# Patient Record
Sex: Male | Born: 1945 | Race: White | Hispanic: No | Marital: Married | Smoking: Never smoker
Health system: Southern US, Community
[De-identification: ages and names within clinical notes are randomized; demographics above are authoritative.]

## PROBLEM LIST (undated history)

## (undated) DIAGNOSIS — G309 Alzheimer's disease, unspecified: Secondary | ICD-10-CM

## (undated) DIAGNOSIS — I10 Essential (primary) hypertension: Secondary | ICD-10-CM

## (undated) DIAGNOSIS — F0280 Dementia in other diseases classified elsewhere without behavioral disturbance: Secondary | ICD-10-CM

---

## 2006-09-13 ENCOUNTER — Emergency Department (HOSPITAL_COMMUNITY): Admission: EM | Admit: 2006-09-13 | Discharge: 2006-09-13 | Payer: Self-pay | Admitting: Emergency Medicine

## 2006-09-26 ENCOUNTER — Ambulatory Visit: Payer: Self-pay | Admitting: Psychology

## 2006-12-08 ENCOUNTER — Ambulatory Visit: Payer: Self-pay | Admitting: Internal Medicine

## 2012-03-12 ENCOUNTER — Other Ambulatory Visit (HOSPITAL_COMMUNITY): Payer: Self-pay | Admitting: Neurology

## 2012-03-12 DIAGNOSIS — R1310 Dysphagia, unspecified: Secondary | ICD-10-CM

## 2012-03-12 DIAGNOSIS — F0390 Unspecified dementia without behavioral disturbance: Secondary | ICD-10-CM

## 2012-03-17 ENCOUNTER — Ambulatory Visit (HOSPITAL_COMMUNITY)
Admission: RE | Admit: 2012-03-17 | Discharge: 2012-03-17 | Disposition: A | Discharge status: Home / Self Care | Payer: Medicare Other | Source: Ambulatory Visit | Attending: Neurology | Admitting: Neurology

## 2012-03-17 DIAGNOSIS — F0390 Unspecified dementia without behavioral disturbance: Secondary | ICD-10-CM

## 2012-03-17 DIAGNOSIS — R1313 Dysphagia, pharyngeal phase: Secondary | ICD-10-CM | POA: Insufficient documentation

## 2012-03-17 DIAGNOSIS — R1310 Dysphagia, unspecified: Secondary | ICD-10-CM | POA: Insufficient documentation

## 2012-03-17 NOTE — Procedures (Signed)
Objective Swallowing Evaluation: Modified Barium Swallowing Study  Patient Details  Name: Keith Herring MRN: [id number] Date of Birth: Jun 20, 1945  Today's Date: 03/17/2012 Time: 1200-1230 SLP Time Calculation (min): 30 min  Past Medical History: No past medical history on file. Past Surgical History: No past surgical history on file. HPI:  67 y/o male with history of dementia referred for objective assessment of MBS due to concerns with aspiration.  Patient's spouse and main caregiver's description of dysphagia is difficulty swallowing pills with increased coughing and "gagging" during meals.  Symptoms started approximately  two weeks prior.  No reports of recent weight loss.     Assessment / Plan / Recommendation Clinical Impression  Dysphagia Diagnosis: Severe oral phase dysphagia;Moderate pharyngeal phase dysphagia Clinical impression: Moderate to severe sensory-motor oral dysphagia with moderate sensory-motor pharyngeal dysphagia.  Oral phase characterized by odd overall disorganized pattern with tongue pumping, reduced bolus cohesion and slow oral transit.  All swallows piecemeal.  Pharyngeal phase characterized by   reduced TBR,  sluggish, inconsistent velopharyngeal closure, reduced pharyngeal peristalsis, decreased hyoid anterior excursion and reduced laryngeal closure.  Trace silent aspiration during the swallow occurred x1 with thin liquid administered by straw out of three trials.  Cued cough and throat clear effective to remove aspirated material.   Moderate amount of residuals in vallecular space s/p swallow with puree and solid trials.  Cued double swallow effective in reducing majority of containment but patient required max verbal cueing to complete.  Patient unable to complete postural maneuver of chin tuck paired with double swallow to remove residuals mainly due to cognitive deficits. Trace residuals in vallecular space and some pooling at pyriforms s/p swallow of thin liquid  barium by cup. Whole barium tablet "hung" in pharynx during swallow at approximately C-4 level. Transit completed during spontaneous second swallow.   Greatest risk of aspiration is after the swallow  due to residuals in valleculae.  Patient self administered liquid trials but was unable to self administer puree and regular solids due to decreased procedural memory.   Recommend to continue current diet of regular consistency and thin liquids with full supervision with PO intake. Crush pills if possible and administer in puree.  Patient to benefit from continued ST services for dysphagia treatment in Out Patient setting as aspiration risk remains even with recommended safe, swallow strategies. Strategies of double swallow effective but question patient's ability to complete consistently.         Treatment Recommendation  Dysphagia treatment in outpatient setting    Diet Recommendation Regular;Thin liquid   Liquid Administration via: Cup;No straw Medication Administration: Crushed with puree Supervision: Patient able to self feed;Full supervision/cueing for compensatory strategies Compensations: Slow rate;Small sips/bites;Multiple dry swallows after each bite/sip Postural Changes and/or Swallow Maneuvers: Seated upright 90 degrees;Upright 30-60 min after meal    Other  Recommendations Oral Care Recommendations: Oral care BID   Follow Up Recommendations  Outpatient SLP                General Date of Onset: 03/11/12 HPI: 67 y/o male with history of dementia referred for objective assessment of MBS due to concerns with aspiration.  Patient's spouse description of dysphagia is difficulty swallowing pills with increased coughing and "gagging" during meals approximately starting two weeks prior.  Type of Study: Modified Barium Swallowing Study Reason for Referral: Objectively evaluate swallowing function Diet Prior to this Study: Regular;Thin liquids Temperature Spikes Noted: No Respiratory  Status: Room air History of Recent Intubation: No Behavior/Cognition: Alert;Cooperative;Pleasant mood;Confused;Requires  cueing;Distractible;Impulsive Oral Cavity - Dentition: Adequate natural dentition Oral Motor / Sensory Function: Impaired motor;Impaired sensory Oral impairment: Right lingual;Right facial;Right labial Self-Feeding Abilities: Needs assist Patient Positioning: Upright in chair Baseline Vocal Quality: Clear Volitional Cough: Strong Volitional Swallow: Able to elicit Anatomy: Within functional limits Pharyngeal Secretions: Not observed secondary MBS    Reason for Referral Objectively evaluate swallowing function   Oral Phase Oral Preparation/Oral Phase Oral Phase: Impaired Oral - Thin Oral - Thin Teaspoon: Lingual pumping;Incomplete tongue to palate contact;Reduced posterior propulsion;Weak lingual manipulation;Holding of bolus;Lingual/palatal residue;Piecemeal swallowing;Decreased velopharyngeal closure;Delayed oral transit Oral - Thin Cup: Lingual pumping;Incomplete tongue to palate contact;Reduced posterior propulsion;Holding of bolus;Lingual/palatal residue;Piecemeal swallowing;Decreased velopharyngeal closure;Delayed oral transit Oral - Thin Straw: Incomplete tongue to palate contact;Piecemeal swallowing;Decreased velopharyngeal closure;Reduced posterior propulsion;Weak lingual manipulation;Holding of bolus;Delayed oral transit Oral - Solids Oral - Puree: Lingual pumping;Incomplete tongue to palate contact;Reduced posterior propulsion;Holding of bolus;Lingual/palatal residue;Piecemeal swallowing;Decreased velopharyngeal closure;Delayed oral transit Oral - Mechanical Soft: Lingual pumping;Incomplete tongue to palate contact;Reduced posterior propulsion;Holding of bolus;Weak lingual manipulation;Piecemeal swallowing;Decreased velopharyngeal closure;Delayed oral transit Oral - Regular: Lingual pumping;Impaired mastication;Weak lingual manipulation;Incomplete tongue to palate  contact;Reduced posterior propulsion;Holding of bolus;Delayed oral transit;Piecemeal swallowing;Decreased velopharyngeal closure   Pharyngeal Phase Pharyngeal Phase Pharyngeal Phase: Impaired Pharyngeal - Thin Pharyngeal - Thin Cup: Delayed swallow initiation;Premature spillage to valleculae;Reduced pharyngeal peristalsis;Reduced anterior laryngeal mobility;Reduced laryngeal elevation;Reduced airway/laryngeal closure;Pharyngeal residue - pyriform sinuses;Pharyngeal residue - cp segment;Pharyngeal residue - valleculae;Reduced tongue base retraction Pharyngeal - Thin Straw: Premature spillage to pyriform sinuses;Reduced pharyngeal peristalsis;Reduced anterior laryngeal mobility;Reduced laryngeal elevation;Reduced tongue base retraction;Reduced airway/laryngeal closure;Penetration/Aspiration during swallow;Trace aspiration;Pharyngeal residue - valleculae;Pharyngeal residue - cp segment;Pharyngeal residue - posterior pharnyx;Compensatory strategies attempted (Comment) (trace silent aspiration during x1 ) Penetration/Aspiration details (thin straw): Material enters airway, remains ABOVE vocal cords and not ejected out Pharyngeal - Solids Pharyngeal - Puree: Reduced pharyngeal peristalsis;Reduced anterior laryngeal mobility;Reduced laryngeal elevation;Reduced airway/laryngeal closure;Reduced tongue base retraction;Pharyngeal residue - valleculae;Pharyngeal residue - posterior pharnyx;Pharyngeal residue - cp segment Pharyngeal - Mechanical Soft: Reduced laryngeal elevation;Reduced pharyngeal peristalsis;Reduced anterior laryngeal mobility;Reduced airway/laryngeal closure;Reduced tongue base retraction;Pharyngeal residue - valleculae;Pharyngeal residue - pyriform sinuses;Pharyngeal residue - cp segment Pharyngeal - Regular: Reduced pharyngeal peristalsis;Reduced anterior laryngeal mobility;Reduced laryngeal elevation;Reduced airway/laryngeal closure;Reduced tongue base retraction;Pharyngeal residue -  valleculae;Pharyngeal residue - pyriform sinuses;Pharyngeal residue - cp segment Pharyngeal - Pill: Reduced pharyngeal peristalsis;Reduced laryngeal elevation;Reduced tongue base retraction;Pharyngeal residue - valleculae Pharyngeal Phase - Comment Pharyngeal Comment: Pill lodged in pharynx approxiamately at C-4 but completed transit with spontaneous dry swallow   Cervical Esophageal Phase    GO    Cervical Esophageal Phase Cervical Esophageal Phase: Impaired Cervical Esophageal Phase - Thin Thin Cup: Reduced cricopharyngeal relaxation Cervical Esophageal Phase - Solids Regular: Reduced cricopharyngeal relaxation    Functional Assessment Tool Used: Clinical judgement  Functional Limitations: Swallowing Swallow Current Status (Z6109): At least 40 percent but less than 60 percent impaired, limited or restricted Swallow Goal Status [phone]): At least 40 percent but less than 60 percent impaired, limited or restricted Swallow Discharge Status [phone]): At least 40 percent but less than 60 percent impaired, limited or restricted   Moreen Fowler MS, CCC-SLP [phone] [facility] 03/17/2012, 3:27 PM

## 2013-08-20 ENCOUNTER — Inpatient Hospital Stay (HOSPITAL_COMMUNITY)
Admission: EM | Admit: 2013-08-20 | Discharge: 2013-08-23 | DRG: 177 | Disposition: A | Discharge status: Hospice | Payer: Medicare Other | Attending: Family Medicine | Admitting: Family Medicine

## 2013-08-20 ENCOUNTER — Encounter (HOSPITAL_COMMUNITY): Payer: Self-pay | Admitting: Emergency Medicine

## 2013-08-20 ENCOUNTER — Emergency Department (HOSPITAL_COMMUNITY): Payer: Medicare Other

## 2013-08-20 DIAGNOSIS — R0902 Hypoxemia: Secondary | ICD-10-CM | POA: Diagnosis not present

## 2013-08-20 DIAGNOSIS — M542 Cervicalgia: Secondary | ICD-10-CM

## 2013-08-20 DIAGNOSIS — E870 Hyperosmolality and hypernatremia: Secondary | ICD-10-CM | POA: Diagnosis present

## 2013-08-20 DIAGNOSIS — J690 Pneumonitis due to inhalation of food and vomit: Secondary | ICD-10-CM | POA: Diagnosis not present

## 2013-08-20 DIAGNOSIS — Z66 Do not resuscitate: Secondary | ICD-10-CM | POA: Diagnosis present

## 2013-08-20 DIAGNOSIS — Z79899 Other long term (current) drug therapy: Secondary | ICD-10-CM

## 2013-08-20 DIAGNOSIS — F0280 Dementia in other diseases classified elsewhere without behavioral disturbance: Secondary | ICD-10-CM | POA: Diagnosis present

## 2013-08-20 DIAGNOSIS — Z515 Encounter for palliative care: Secondary | ICD-10-CM

## 2013-08-20 DIAGNOSIS — R4701 Aphasia: Secondary | ICD-10-CM | POA: Diagnosis present

## 2013-08-20 DIAGNOSIS — I10 Essential (primary) hypertension: Secondary | ICD-10-CM | POA: Diagnosis present

## 2013-08-20 DIAGNOSIS — G309 Alzheimer's disease, unspecified: Secondary | ICD-10-CM | POA: Diagnosis present

## 2013-08-20 DIAGNOSIS — Z993 Dependence on wheelchair: Secondary | ICD-10-CM

## 2013-08-20 DIAGNOSIS — Z7982 Long term (current) use of aspirin: Secondary | ICD-10-CM

## 2013-08-20 DIAGNOSIS — F0390 Unspecified dementia without behavioral disturbance: Secondary | ICD-10-CM | POA: Diagnosis present

## 2013-08-20 DIAGNOSIS — G9340 Encephalopathy, unspecified: Secondary | ICD-10-CM | POA: Diagnosis not present

## 2013-08-20 HISTORY — DX: Essential (primary) hypertension: I10

## 2013-08-20 HISTORY — DX: Alzheimer's disease, unspecified: G30.9

## 2013-08-20 HISTORY — DX: Dementia in other diseases classified elsewhere, unspecified severity, without behavioral disturbance, psychotic disturbance, mood disturbance, and anxiety: F02.80

## 2013-08-20 LAB — CBC WITH DIFFERENTIAL/PLATELET
Basophils Absolute: 0 10*3/uL (ref 0.0–0.1)
Basophils Relative: 0 % (ref 0–1)
EOS PCT: 2 % (ref 0–5)
Eosinophils Absolute: 0.2 10*3/uL (ref 0.0–0.7)
HCT: 44.1 % (ref 39.0–52.0)
Hemoglobin: 15 g/dL (ref 13.0–17.0)
LYMPHS ABS: 2.5 10*3/uL (ref 0.7–4.0)
LYMPHS PCT: 25 % (ref 12–46)
MCH: 32.1 pg (ref 26.0–34.0)
MCHC: 34 g/dL (ref 30.0–36.0)
MCV: 94.4 fL (ref 78.0–100.0)
Monocytes Absolute: 0.9 10*3/uL (ref 0.1–1.0)
Monocytes Relative: 9 % (ref 3–12)
NEUTROS ABS: 6.4 10*3/uL (ref 1.7–7.7)
Neutrophils Relative %: 64 % (ref 43–77)
PLATELETS: 205 10*3/uL (ref 150–400)
RBC: 4.67 MIL/uL (ref 4.22–5.81)
RDW: 12.9 % (ref 11.5–15.5)
WBC: 10 10*3/uL (ref 4.0–10.5)

## 2013-08-20 LAB — I-STAT CG4 LACTIC ACID, ED: Lactic Acid, Venous: 1.14 mmol/L (ref 0.5–2.2)

## 2013-08-20 NOTE — ED Notes (Signed)
Dr Otter at bedside  

## 2013-08-20 NOTE — ED Notes (Signed)
Pt arrives via EMS from [facility], pt is currently in transition to the memory care unit. Pts wife came to check on the pt and found him with sputum coming out of his mouth and blue in the lips. pts wife states that while pt was sitting up pt was not coughing or gurgling. States that once he laid down the symptoms started. Wife denies hx of seizures. Facility states that pt has been lethargic x4 days, pt was checked for UTI, negative. Pt is normally non-verbal with hx of alzheimers, dementia and htn. EMS found pupils to be pinpoint and appeared unreactive. EMS gave 1 Narcan, pupils became more reactive. pts wife states that pt stays in pain from head to toe and takes pain medicine twice a day. Wife also states that up until a week ago pt was ambulatory. States that the facility did a swallow test on pt because he was not swallowing well. 146/74 HR 100 .

## 2013-08-21 ENCOUNTER — Encounter (HOSPITAL_COMMUNITY): Payer: Self-pay | Admitting: Internal Medicine

## 2013-08-21 ENCOUNTER — Emergency Department (HOSPITAL_COMMUNITY): Payer: Medicare Other

## 2013-08-21 DIAGNOSIS — E870 Hyperosmolality and hypernatremia: Secondary | ICD-10-CM | POA: Diagnosis present

## 2013-08-21 DIAGNOSIS — Z66 Do not resuscitate: Secondary | ICD-10-CM | POA: Diagnosis present

## 2013-08-21 DIAGNOSIS — G309 Alzheimer's disease, unspecified: Secondary | ICD-10-CM

## 2013-08-21 DIAGNOSIS — J690 Pneumonitis due to inhalation of food and vomit: Secondary | ICD-10-CM | POA: Diagnosis present

## 2013-08-21 DIAGNOSIS — G9340 Encephalopathy, unspecified: Secondary | ICD-10-CM | POA: Diagnosis not present

## 2013-08-21 DIAGNOSIS — Z7982 Long term (current) use of aspirin: Secondary | ICD-10-CM | POA: Diagnosis not present

## 2013-08-21 DIAGNOSIS — M542 Cervicalgia: Secondary | ICD-10-CM

## 2013-08-21 DIAGNOSIS — F0280 Dementia in other diseases classified elsewhere without behavioral disturbance: Secondary | ICD-10-CM

## 2013-08-21 DIAGNOSIS — Z515 Encounter for palliative care: Secondary | ICD-10-CM | POA: Diagnosis not present

## 2013-08-21 DIAGNOSIS — Z79899 Other long term (current) drug therapy: Secondary | ICD-10-CM | POA: Diagnosis not present

## 2013-08-21 DIAGNOSIS — R4701 Aphasia: Secondary | ICD-10-CM | POA: Diagnosis present

## 2013-08-21 DIAGNOSIS — F0390 Unspecified dementia without behavioral disturbance: Secondary | ICD-10-CM | POA: Diagnosis present

## 2013-08-21 DIAGNOSIS — I10 Essential (primary) hypertension: Secondary | ICD-10-CM | POA: Diagnosis present

## 2013-08-21 DIAGNOSIS — Z993 Dependence on wheelchair: Secondary | ICD-10-CM | POA: Diagnosis not present

## 2013-08-21 DIAGNOSIS — R0902 Hypoxemia: Secondary | ICD-10-CM | POA: Diagnosis present

## 2013-08-21 LAB — COMPREHENSIVE METABOLIC PANEL
ALK PHOS: 108 U/L (ref 39–117)
ALT: 53 U/L (ref 0–53)
AST: 70 U/L — AB (ref 0–37)
Albumin: 3.5 g/dL (ref 3.5–5.2)
Anion gap: 15 (ref 5–15)
BILIRUBIN TOTAL: 0.5 mg/dL (ref 0.3–1.2)
BUN: 34 mg/dL — ABNORMAL HIGH (ref 6–23)
CALCIUM: 9.2 mg/dL (ref 8.4–10.5)
CHLORIDE: 106 meq/L (ref 96–112)
CO2: 27 meq/L (ref 19–32)
Creatinine, Ser: 0.82 mg/dL (ref 0.50–1.35)
GFR, EST NON AFRICAN AMERICAN: 88 mL/min — AB (ref >90)
GLUCOSE: 115 mg/dL — AB (ref 70–99)
POTASSIUM: 4 meq/L (ref 3.7–5.3)
SODIUM: 148 meq/L — AB (ref 137–147)
Total Protein: 8.1 g/dL (ref 6.0–8.3)

## 2013-08-21 LAB — MRSA PCR SCREENING: MRSA by PCR: NEGATIVE

## 2013-08-21 MED ORDER — SODIUM CHLORIDE 0.9 % IV SOLN: 250.0000 mL | INTRAVENOUS | Status: DC | PRN

## 2013-08-21 MED ORDER — DOCUSATE SODIUM 100 MG PO CAPS: 100.0000 mg | ORAL_CAPSULE | Freq: Every day | ORAL | Status: DC

## 2013-08-21 MED ORDER — FLEET ENEMA 7-19 GM/118ML RE ENEM
1.0000 | ENEMA | Freq: Once | RECTAL | Status: AC | PRN
Start: 2013-08-21 — End: 2013-08-21
  Filled 2013-08-21: qty 1

## 2013-08-21 MED ORDER — CLONAZEPAM 1 MG PO TABS: 1.0000 mg | ORAL_TABLET | Freq: Every day | ORAL | Status: DC

## 2013-08-21 MED ORDER — IPRATROPIUM-ALBUTEROL 0.5-2.5 (3) MG/3ML IN SOLN
3.0000 mL | Freq: Once | RESPIRATORY_TRACT | Status: AC
  Administered 2013-08-21: 3 mL via RESPIRATORY_TRACT
  Filled 2013-08-21: qty 3

## 2013-08-21 MED ORDER — POLYETHYLENE GLYCOL 3350 17 G PO PACK
17.0000 g | PACK | Freq: Every day | ORAL | Status: DC | PRN
  Filled 2013-08-21: qty 1

## 2013-08-21 MED ORDER — HALOPERIDOL LACTATE 5 MG/ML IJ SOLN: 2.0000 mg | Freq: Four times a day (QID) | INTRAMUSCULAR | Status: DC | PRN

## 2013-08-21 MED ORDER — MORPHINE SULFATE 2 MG/ML IJ SOLN: 2.0000 mg | INTRAMUSCULAR | Status: DC | PRN

## 2013-08-21 MED ORDER — LORATADINE 10 MG PO TABS
10.0000 mg | ORAL_TABLET | Freq: Every day | ORAL | Status: DC
  Filled 2013-08-21: qty 1

## 2013-08-21 MED ORDER — ALBUTEROL SULFATE (2.5 MG/3ML) 0.083% IN NEBU: 2.5000 mg | INHALATION_SOLUTION | RESPIRATORY_TRACT | Status: DC | PRN

## 2013-08-21 MED ORDER — MORPHINE SULFATE 2 MG/ML IJ SOLN
1.0000 mg | INTRAMUSCULAR | Status: DC
  Administered 2013-08-21 – 2013-08-22 (×4): 1 mg via INTRAVENOUS
  Filled 2013-08-21 (×3): qty 1

## 2013-08-21 MED ORDER — FUROSEMIDE 20 MG PO TABS
20.0000 mg | ORAL_TABLET | Freq: Every day | ORAL | Status: DC
  Administered 2013-08-22: 20 mg via ORAL
  Filled 2013-08-21 (×3): qty 1

## 2013-08-21 MED ORDER — DIVALPROEX SODIUM 125 MG PO CPSP
125.0000 mg | ORAL_CAPSULE | Freq: Two times a day (BID) | ORAL | Status: DC
  Filled 2013-08-21 (×2): qty 1

## 2013-08-21 MED ORDER — SODIUM CHLORIDE 0.9 % IV BOLUS (SEPSIS)
1000.0000 mL | Freq: Once | INTRAVENOUS | Status: AC
Start: 2013-08-21 — End: 2013-08-21
  Administered 2013-08-21: 1000 mL via INTRAVENOUS

## 2013-08-21 MED ORDER — AMLODIPINE BESYLATE 5 MG PO TABS
5.0000 mg | ORAL_TABLET | Freq: Every day | ORAL | Status: DC
  Filled 2013-08-21: qty 1

## 2013-08-21 MED ORDER — MORPHINE SULFATE 2 MG/ML IJ SOLN
2.0000 mg | INTRAMUSCULAR | Status: DC | PRN
  Administered 2013-08-21: 2 mg via INTRAVENOUS
  Filled 2013-08-21: qty 1

## 2013-08-21 MED ORDER — ACETAMINOPHEN 650 MG RE SUPP
650.0000 mg | Freq: Four times a day (QID) | RECTAL | Status: DC | PRN
  Administered 2013-08-21 – 2013-08-22 (×2): 650 mg via RECTAL
  Filled 2013-08-21: qty 1

## 2013-08-21 MED ORDER — DONEPEZIL HCL 23 MG PO TABS
23.0000 mg | ORAL_TABLET | Freq: Every day | ORAL | Status: DC
  Filled 2013-08-21: qty 1

## 2013-08-21 MED ORDER — ONDANSETRON HCL 4 MG PO TABS: 4.0000 mg | ORAL_TABLET | Freq: Four times a day (QID) | ORAL | Status: DC | PRN

## 2013-08-21 MED ORDER — PIPERACILLIN-TAZOBACTAM 3.375 G IVPB
3.3750 g | Freq: Three times a day (TID) | INTRAVENOUS | Status: DC
  Administered 2013-08-21 – 2013-08-22 (×4): 3.375 g via INTRAVENOUS
  Filled 2013-08-21 (×5): qty 50

## 2013-08-21 MED ORDER — LORAZEPAM 2 MG/ML IJ SOLN: 1.0000 mg | Freq: Four times a day (QID) | INTRAMUSCULAR | Status: DC | PRN

## 2013-08-21 MED ORDER — FLUTICASONE PROPIONATE 50 MCG/ACT NA SUSP
1.0000 | Freq: Two times a day (BID) | NASAL | Status: DC
  Administered 2013-08-21: 1 via NASAL
  Filled 2013-08-21: qty 16

## 2013-08-21 MED ORDER — HYDROCODONE-ACETAMINOPHEN 5-325 MG PO TABS: 1.0000 | ORAL_TABLET | ORAL | Status: DC | PRN

## 2013-08-21 MED ORDER — QUETIAPINE FUMARATE 25 MG PO TABS
25.0000 mg | ORAL_TABLET | Freq: Every day | ORAL | Status: DC
  Filled 2013-08-21: qty 1

## 2013-08-21 MED ORDER — PIPERACILLIN-TAZOBACTAM 3.375 G IVPB 30 MIN
3.3750 g | Freq: Once | INTRAVENOUS | Status: AC
  Administered 2013-08-21: 3.375 g via INTRAVENOUS
  Filled 2013-08-21: qty 50

## 2013-08-21 MED ORDER — MORPHINE SULFATE 2 MG/ML IJ SOLN
2.0000 mg | Freq: Four times a day (QID) | INTRAMUSCULAR | Status: DC | PRN
  Filled 2013-08-21: qty 1

## 2013-08-21 MED ORDER — SENNA 8.6 MG PO TABS
1.0000 | ORAL_TABLET | Freq: Two times a day (BID) | ORAL | Status: DC
  Filled 2013-08-21 (×2): qty 1

## 2013-08-21 MED ORDER — SERTRALINE HCL 50 MG PO TABS
50.0000 mg | ORAL_TABLET | Freq: Every day | ORAL | Status: DC
  Filled 2013-08-21: qty 1

## 2013-08-21 MED ORDER — ACETAMINOPHEN 325 MG PO TABS: 650.0000 mg | ORAL_TABLET | Freq: Four times a day (QID) | ORAL | Status: DC | PRN

## 2013-08-21 MED ORDER — LIDOCAINE 5 % EX PTCH
1.0000 | MEDICATED_PATCH | CUTANEOUS | Status: DC
  Administered 2013-08-21 – 2013-08-22 (×2): 1 via TRANSDERMAL
  Filled 2013-08-21 (×3): qty 1

## 2013-08-21 MED ORDER — SCOPOLAMINE 1 MG/3DAYS TD PT72
1.0000 | MEDICATED_PATCH | TRANSDERMAL | Status: DC
  Administered 2013-08-21: 1.5 mg via TRANSDERMAL
  Filled 2013-08-21 (×2): qty 1

## 2013-08-21 MED ORDER — ONDANSETRON HCL 4 MG/2ML IJ SOLN: 4.0000 mg | Freq: Four times a day (QID) | INTRAMUSCULAR | Status: DC | PRN

## 2013-08-21 MED ORDER — BISACODYL 10 MG RE SUPP: 10.0000 mg | Freq: Every day | RECTAL | Status: DC | PRN

## 2013-08-21 MED ORDER — SODIUM CHLORIDE 0.9 % IJ SOLN: 3.0000 mL | INTRAMUSCULAR | Status: DC | PRN

## 2013-08-21 MED ORDER — SODIUM CHLORIDE 0.9 % IJ SOLN
3.0000 mL | Freq: Two times a day (BID) | INTRAMUSCULAR | Status: DC
  Administered 2013-08-21 – 2013-08-22 (×2): 3 mL via INTRAVENOUS

## 2013-08-21 MED ORDER — MEMANTINE HCL 10 MG PO TABS
10.0000 mg | ORAL_TABLET | Freq: Two times a day (BID) | ORAL | Status: DC
  Filled 2013-08-21 (×3): qty 1

## 2013-08-21 NOTE — Consult Note (Addendum)
Patient TK:WIOX Keith Herring      DOB: March 29, 1945      BDZ:[id number]  Summary of Goals of care ; full note to follow:  Met with wife, Daugther Keith Herring, son in law Keith Herring; grandson Keith Herring (turning 5 today).  Patient not responsive to gentle tactile stimuli but will yell out if you attempt to move him.  Patient has had muscle pain for a long time.  He has had debilitating dementia for 8 yr- he is non verbal and can become combative.  He has been eating up until this time but over the last week has stopped eating , walking .  It is suspected that he has aspirated despite relatively benign chest xray he was found unresponsive and gurgly.  Spouse states it time to focus on his comfort. She understands that he may pass away soon. She would want to be called if we thought it was close . Her number is [phone]  Or her daughter [phone].   Recommend:  1. DNR  2.  Dyspnea: schedule low dose morphine with prn for breakthrough  3.  Terminal secretions: scopalomine  4.  Pain all over:  Schedule morphine, add lidocaine patch to neck and then reposition with pillow  5.  Agitation : unable to take oral meds- agree with ativan, will add prn haldol and if needed use IV depacon (has been in short supply so use only if haldol and ativan not effective)  Prognosis:hours to days   Total time:  330 - 440 pm  Gola Bribiesca L. Lovena Le, MD MBA The Palliative Medicine Team at [facility] Phone: [phone] Pager: [phone]

## 2013-08-21 NOTE — ED Provider Notes (Signed)
CSN: [id number]     Arrival date & time 08/20/13  2322 History   First MD Initiated Contact with Patient 08/20/13 2333     Chief Complaint  Patient presents with  . Altered Mental Status     (Consider location/radiation/quality/duration/timing/severity/associated sxs/prior Treatment) HPI 68 year old male presents to emergency room via EMS from his nursing facility with respiratory distress.  Patient is nonverbal.  Wife gives history.  She reports over the last week he has had increasing difficulties with handling his secretions and shortness of breath with gurgling.  She reports his symptoms are much worse when he lay flat.  Patient had been verbal until few weeks ago.  He is in process of moving to the memory care Center.  EMS reports patient has been had increasing lethargy the last 4 days.  UTI negative.  Wife reports over the last week patient has been complaining of total body pain worse in the left arm in the area of a prior clavicle fracture.  Patient has recently become nonambulatory over the last week thought to be secondary to pain.  Patient is on pain medication.  Patient was given Narcan by EMS with some improvement in responsiveness. Past Medical History  Diagnosis Date  . Hypertension   . Alzheimer's dementia    History reviewed. No pertinent past surgical history. No family history on file. History  Substance Use Topics  . Smoking status: Never Smoker   . Smokeless tobacco: Not on file  . Alcohol Use: No    Review of Systems  Unable to perform ROS: Patient nonverbal      Allergies  Review of patient's allergies indicates no known allergies.  Home Medications   Prior to Admission medications   Not on File   BP 176/94  Pulse 90  Temp(Src) 97.5 F (36.4 C) (Axillary)  Resp 25  Ht 5\' 10"  (1.778 m)  Wt 180 lb (81.647 kg)  BMI 25.83 kg/m2  SpO2 91% Physical Exam  Nursing note and vitals reviewed. Constitutional:  Patient appears younger than stated age,  patient is nonverbal, he is responsive to pain  HENT:  Head: Normocephalic and atraumatic.  Patient has dry mucous membranes, thick sputum/saliva pooling in the back of the throat  Neck: Normal range of motion. Neck supple. No JVD present. No tracheal deviation present. No thyromegaly present.  Coarse upper airway noises  Cardiovascular: Normal rate, regular rhythm, normal heart sounds and intact distal pulses.  Exam reveals no gallop and no friction rub.   No murmur heard. Pulmonary/Chest: No stridor. He is in respiratory distress. He has rales.  Patient has gurgling respirations, coarse breath sounds right greater than left  Abdominal: Soft. Bowel sounds are normal. He exhibits no distension and no mass. There is no tenderness. There is no rebound and no guarding.  Musculoskeletal: He exhibits tenderness. He exhibits no edema.  Patient has pain with palpation and movement of the left upper extremity  Lymphadenopathy:    He has no cervical adenopathy.  Neurological:  Somnolent, will open eyes to verbal stimulation.  Does not follow commands.  Skin: Skin is warm and dry. No rash noted. No erythema. No pallor.    ED Course  Procedures (including critical care time) Labs Review Labs Reviewed  COMPREHENSIVE METABOLIC PANEL - Abnormal; Notable for the following:    Sodium 148 (*)    Glucose, Bld 115 (*)    BUN 34 (*)    AST 70 (*)    GFR calc non Af Amer 88 (*)  All other components within normal limits  CBC WITH DIFFERENTIAL  I-STAT CG4 LACTIC ACID, ED    Imaging Review Ct Head Wo Contrast  08/21/2013   CLINICAL DATA:  Altered mental status.  Lethargy for 4 days.  EXAM: CT HEAD WITHOUT CONTRAST  TECHNIQUE: Contiguous axial images were obtained from the base of the skull through the vertex without intravenous contrast.  COMPARISON:  None.  FINDINGS: There is no evidence of acute infarction, mass lesion, or intra- or extra-axial hemorrhage on CT.  Prominence of the ventricles and  sulci reflects moderate cortical volume loss. Diffuse periventricular and subcortical white matter change likely reflects small vessel ischemic microangiopathy.  The brainstem and fourth ventricle are within normal limits. The basal ganglia are unremarkable in appearance. The cerebral hemispheres demonstrate grossly normal gray-white differentiation. No mass effect or midline shift is seen.  There is no evidence of fracture; visualized osseous structures are unremarkable in appearance. The orbits are within normal limits. The paranasal sinuses and mastoid air cells are well-aerated. No significant soft tissue abnormalities are seen.  IMPRESSION: 1. No acute intracranial pathology seen on CT. 2. Moderate cortical volume loss and diffuse small vessel ischemic microangiopathy.   Electronically Signed   By: Roanna RaiderJeffery  Chang M.D.   On: 08/21/2013 01:05   Dg Chest Port 1 View  08/21/2013   CLINICAL DATA:  Shortness of breath and cough.  EXAM: PORTABLE CHEST - 1 VIEW  COMPARISON:  None.  FINDINGS: The lungs are well-aerated. Minimal left basilar opacity may reflect atelectasis or scarring. There is no evidence of pleural effusion or pneumothorax.  The cardiomediastinal silhouette is within normal limits. There is a chronic displaced fracture of the middle third of the left clavicle.  IMPRESSION: 1. Minimal left basilar opacity may reflect atelectasis or scarring; lungs otherwise clear. 2. Chronic displaced fracture of the middle third of the left clavicle.   Electronically Signed   By: Roanna RaiderJeffery  Chang M.D.   On: 08/21/2013 00:21     EKG Interpretation None      MDM   Final diagnoses:  Hypernatremia  Aspiration pneumonia, unspecified aspiration pneumonia type  Alzheimer disease    68 year old male with respiratory difficulty, concern for aspiration.  Differential includes recent stroke versus worsening of his underlying dementia, probable aspiration pneumonia.  Plan for x-ray labs.  Patient will need  admission to hospital and, probable head CT.    Olivia Mackielga M Keimari , MD 08/21/13 [phone]

## 2013-08-21 NOTE — Progress Notes (Signed)
Subjective: 68 y/o male admitted this morning with shortness of breath, he has h/o Alzheimer's dementia, patient has been nonverbal as per patient's wife and is a poor quality of life.likely has aspiration with hypoxia , family wishes for comfort care. Antibiotics Zosyn started. Filed Vitals:   08/21/13 1405  BP: 146/75  Pulse: 88  Temp: 99.8 F (37.7 C)  Resp: 24    Chest: mild tachypnea, bilateral rhonchi Heart : S1S2 RRR Abdomen: Soft, nontender Ext : No edema Neuro: stuporous, does not open eyes to verbal or tactile stimuli  A/P Aspiration pneumonia Dementia Hypoxia  We'll continue with Zosyn for possible aspiration pneumonia We'll consult palliative care for goals of care discussion. Continue scopolamine patch for increased secretions We'll start morphine 2 mg IV every 4 hours when necessary for dyspnea and pain    Meredeth IdeGagan S Jahzion Brogden [facility] [phone]

## 2013-08-21 NOTE — Consult Note (Signed)
Patient Keith Herring      DOB: 07/16/1945      EAV:[id number]     Consult Note from the Palliative Medicine Team at [facility]    Consult Requested by: Dr. Sharl Ma     PCP: Minda Meo, MD Reason for Consultation: GO C     Phone Number:[phone] Symptom management Assessment of patients Current state: 68 yr old white male with advanced dementia presenting with shortness of breath and altered mentation.  Patient has been bed bound for last few days which is not his baseline.  Patient has chronic pain , and has been becoming more debilitated over the last months to now week.  Patient's spouse states he has suffered enough.  She is at peace with focusing on comfort care only .  She does not want anything that will prolong his life with the exception of continuing antibiotics at least overnight.  She is open to aggressive symptom management for his pain and agitation.  Will reassess in the am .  Family open to considering residential hospice placement.   Goals of Care: 1.  Code Status: DNR   2. Scope of Treatment: Continue antibiotics for at least tonight.  Start Scheduled morphine for pain and dyspnea  4. Disposition: to be determined. Family open to hospice facility if patient survives the night.   3. Symptom Management:   1. Anxiety/Agitation: patient very sedated at present, could add benzo if this changes 2. Pain: Will schedule low dose morphine with prn dosing, lidocaine patch to neck and order air mattress overlay. 3. Dyspnea: morphine as above 4. Terminal Secretions: scopolamine and atropine prn.  4. Psychosocial:  Married, one daughter  3 grandchildren.  His one grandson is turning 5 today.  5. Spiritual: offered spiritual care prn         Patient Documents Completed or Given: Document Given Completed  Advanced Directives Pkt    MOST    DNR    Gone from My Sight    Hard Choices      Brief HPI: 68 yr old white male with a history of advanced dementia  diagnosed 8 yrs ago.  Patient lives with spouse in an assisted setting.  He had been able to ambulate with assistance but over the last week had a rapid decline and took to his bed with decreasing po intake.  He was admitted with difficulty breathing.  He has had chronic pain for some time .  He was also noted to be having trouble swallowing . While his chest xray appeared without infiltrate his clinical symptoms suggest aspiration pneumonia.  At present his breathing is loud and rough.  He is not handling his secretions well.     ROS: unable to be reviewed due to altered mental status   PMH:  Past Medical History  Diagnosis Date  . Hypertension   . Alzheimer's dementia      QMV:HQIONGE reviewed. No pertinent past surgical history. I have reviewed the FH and SH and  If appropriate update it with new information. No Known Allergies Scheduled Meds: . fluticasone  1 spray Each Nare BID  . furosemide  20 mg Oral Daily  . lidocaine  1 patch Transdermal Q24H  .  morphine injection  1 mg Intravenous Q4H  . piperacillin-tazobactam (ZOSYN)  IV  3.375 g Intravenous Q8H  . scopolamine  1 patch Transdermal Q72H  . sodium chloride  3 mL Intravenous Q12H   Continuous Infusions:  PRN Meds:.sodium chloride, acetaminophen, acetaminophen, albuterol,  bisacodyl, haloperidol lactate, LORazepam, morphine injection, ondansetron (ZOFRAN) IV, ondansetron, sodium chloride, sodium phosphate    BP 146/75  Pulse 88  Temp(Src) 99.8 F (37.7 C) (Axillary)  Resp 24  Ht 5\' 10"  (1.778 m)  Wt 82 kg (180 lb 12.4 oz)  BMI 25.94 kg/m2  SpO2 98%   PPS: 10%  No intake or output data in the 24 hours ending 08/21/13 1646 LBM:                Physical Exam:  General: not responsive unless attempts are made to move him then he calls out HEENT:  Pupils not examined, mm dry, mouth breathing  Chest:   Decreased with coarse wet upper airway rhonchi, no wheezing CVS: regular, S1, S2 Abdomen:soft, no grimace on  palpation, Ext: rigid extremities, trace to 1+ edema Neuro:obutunded, not following commands only moans if moved  Labs: CBC    Component Value Date/Time   WBC 10.0 08/20/2013 2349   RBC 4.67 08/20/2013 2349   HGB 15.0 08/20/2013 2349   HCT 44.1 08/20/2013 2349   PLT 205 08/20/2013 2349   MCV 94.4 08/20/2013 2349   MCH 32.1 08/20/2013 2349   MCHC 34.0 08/20/2013 2349   RDW 12.9 08/20/2013 2349   LYMPHSABS 2.5 08/20/2013 2349   MONOABS 0.9 08/20/2013 2349   EOSABS 0.2 08/20/2013 2349   BASOSABS 0.0 08/20/2013 2349       CMP     Component Value Date/Time   NA 148* 08/20/2013 2349   K 4.0 08/20/2013 2349   CL 106 08/20/2013 2349   CO2 27 08/20/2013 2349   GLUCOSE 115* 08/20/2013 2349   BUN 34* 08/20/2013 2349   CREATININE 0.82 08/20/2013 2349   CALCIUM 9.2 08/20/2013 2349   PROT 8.1 08/20/2013 2349   ALBUMIN 3.5 08/20/2013 2349   AST 70* 08/20/2013 2349   ALT 53 08/20/2013 2349   ALKPHOS 108 08/20/2013 2349   BILITOT 0.5 08/20/2013 2349   GFRNONAA 88* 08/20/2013 2349   GFRAA >90 08/20/2013 2349    Chest Xray Reviewed/Impressions: 1. Minimal left basilar opacity may reflect atelectasis or scarring;  lungs otherwise clear.  2. Chronic displaced fracture of the middle third of the left  clavicle   CT scan of the Head Reviewed/Impressions: 1. No acute intracranial pathology seen on CT.  2. Moderate cortical volume loss and diffuse small vessel ischemic  microangiopathy    Time In Time Out Total Time Spent with Patient Total Overall Time  330 pm 440 pm 70 pm 70 pm    Greater than 50%  of this time was spent counseling and coordinating care related to the above assessment and plan.  Chrisie Jankovich L. Ladona Ridgelaylor, MD MBA [facility] Phone: [phone] Pager: [phone] ( Use team phone after hours)

## 2013-08-21 NOTE — ED Notes (Signed)
Spoke with Dr Adela Glimpseoutova - stated that pt will be comfort care and can go to 5W.

## 2013-08-21 NOTE — Progress Notes (Signed)
68yo male comes from memory care was found w/ sputum coming from mouth and blue lips, concern for aspiration, to begin IV ABX.  Will start Zosyn 3.375g IV Q8H for CrCl ~90 ml/min and monitor CBC and Cx.  Vernard GamblesVeronda Sidonie Dexheimer, PharmD, BCPS 08/21/2013 3:58 AM

## 2013-08-21 NOTE — Progress Notes (Signed)
Chaplain received request from pt's wife for prayer. Chaplain provided emotional support, empathic listening, pastoral presence, and prayer. Pt's wife, [name], appreciated visit. Will attempt to follow up in the AM, please page if needed.   Maurene CapesHillary D Irusta [phone]

## 2013-08-21 NOTE — ED Notes (Signed)
CG-4 result reported to Dr. Otter 

## 2013-08-21 NOTE — Progress Notes (Signed)
Chaplain followed up with patient's wife this morning. She was eager to talk with chaplain, saying she was seriously considering Hospice for her husband. She said he has said, "Everything is gone," referring to his health, his abilities, his quality of life. Quality of life was one of her major concerns, she said "his body is shutting down, he is in pain, he doesn't want to be like this and I don't want him like this. Chaplain asked what her realistic hopes are for him. She said, "peace, to be released from this prison, to go to heaven." She did express feeling "selfish wishing he would go quickly." Chaplain spoke with pt's RN who is aware that she would like to speak to pt's physician about Hospice possibilities. Chaplain provided empathic reflective listening, emotional support, and pastoral presence. Please page if she requests further support.  Maurene CapesHillary D Irusta [phone]

## 2013-08-21 NOTE — H&P (Signed)
PCP: unsure of the name but works for [email or web address]   Chief Complaint:  Trouble breathing  HPI: Keith Herring is a 68 y.o. male   has a past medical history of Hypertension and Alzheimer's dementia.   Patient have had sever progressive dementia for the past 8 years. He lives with his wife at [facility]. For the past 1.5 weeks he has been wheelchair or bed bound. He is incontinent of urine and stool. He is aphasic. Family states he gets very agitated. He has chronic pain. Patient often screams out in pain. For the past 3 days he could not lay flat and had trouble swallowing saliva. He was turning blue. Family brought him to ER. He was noted to be hypoxic with course breath sounds on exam but CXR did not show infiltrate. Spoke to family who wishes for patient to be comfort care only ok with antibiotics. Would like to discuss with hospice. Interested in palliative care consult.   Hospitalist was called for admission for hypoxia likely due to aspiration  Review of Systems:    Pertinent positives include:  Difficulty swallowing, shortness of breath at rest. Constitutional:  No weight loss, night sweats, Fevers, chills, fatigue, weight loss  HEENT:  No headaches, Tooth/dental problems,Sore throat,  No sneezing, itching, ear ache, nasal congestion, post nasal drip,  Cardio-vascular:  No chest pain, Orthopnea, PND, anasarca, dizziness, palpitations.no Bilateral lower extremity swelling  GI:  No heartburn, indigestion, abdominal pain, nausea, vomiting, diarrhea, change in bowel habits, loss of appetite, melena, blood in stool, hematemesis Resp:  no  No dyspnea on exertion, No excess mucus, no productive cough, No non-productive cough, No coughing up of blood.No change in color of mucus.No wheezing. Skin:  no rash or lesions. No jaundice GU:  no dysuria, change in color of urine, no urgency or frequency. No straining to urinate.  No flank pain.    Musculoskeletal:  No joint pain or no joint swelling. No decreased range of motion. No back pain.  Psych:  No change in mood or affect. No depression or anxiety. No memory loss.  Neuro: no localizing neurological complaints, no tingling, no weakness, no double vision, no gait abnormality, no slurred speech, no confusion  Otherwise ROS are negative except for above, 10 systems were reviewed  Past Medical History: Past Medical History  Diagnosis Date  . Hypertension   . Alzheimer's dementia    History reviewed. No pertinent past surgical history.   Medications: Prior to Admission medications   Medication Sig Start Date End Date Taking? Authorizing Provider  acetaminophen (TYLENOL) 325 MG tablet Take 650 mg by mouth 2 (two) times daily as needed for mild pain.   Yes Historical Provider, MD  amLODipine (NORVASC) 5 MG tablet Take 5 mg by mouth daily.   Yes Historical Provider, MD  aspirin EC 81 MG tablet Take 81 mg by mouth daily.   Yes Historical Provider, MD  Cholecalciferol (VITAMIN D-3) 1000 UNITS CAPS Take 1 capsule by mouth daily.   Yes Historical Provider, MD  clonazePAM (KLONOPIN) 1 MG tablet Take 1 mg by mouth at bedtime.   Yes Historical Provider, MD  divalproex (DEPAKOTE SPRINKLE) 125 MG capsule Take 125 mg by mouth 2 (two) times daily.   Yes Historical Provider, MD  docusate sodium (COLACE) 100 MG capsule Take 100 mg by mouth daily.   Yes Historical Provider, MD  donepezil (ARICEPT) 23 MG TABS tablet Take 23 mg by mouth daily.   Yes Historical  Provider, MD  fexofenadine (ALLEGRA) 180 MG tablet Take 180 mg by mouth daily.   Yes Historical Provider, MD  fluticasone (FLONASE) 50 MCG/ACT nasal spray Place 1 spray into both nostrils 2 (two) times daily.   Yes Historical Provider, MD  furosemide (LASIX) 20 MG tablet Take 20 mg by mouth daily.   Yes Historical Provider, MD  memantine (NAMENDA) 10 MG tablet Take 10 mg by mouth 2 (two) times daily.   Yes Historical Provider, MD   nystatin cream (MYCOSTATIN) Apply 1 application topically 2 (two) times daily.   Yes Historical Provider, MD  QUEtiapine (SEROQUEL) 25 MG tablet Take 25 mg by mouth at bedtime.   Yes Historical Provider, MD  sertraline (ZOLOFT) 50 MG tablet Take 50 mg by mouth daily.   Yes Historical Provider, MD    Allergies:  No Known Allergies  Social History:  Ambulatory  wheelchair bound or bed bound From facility [facility]   reports that he has never smoked. He does not have any smokeless tobacco history on file. He reports that he does not drink alcohol or use illicit drugs.    Family History: family history includes Alcohol abuse in his father; CAD in his father; Cancer in his mother.    Physical Exam: Patient Vitals for the past 24 hrs:  BP Temp Temp src Pulse Resp SpO2 Height Weight  08/21/13 0125 - - - 88 17 88 % - -  08/21/13 0100 148/65 mmHg - - 86 28 87 % - -  08/21/13 0030 130/100 mmHg - - 89 24 92 % - -  08/21/13 0015 150/71 mmHg - - 93 30 86 % - -  08/20/13 2335 176/94 mmHg 97.5 F (36.4 C) Axillary 90 25 91 % 5\' 10"  (1.778 m) 81.647 kg (180 lb)    1. General:  in No Acute distress 2. Psychological: somnolent not Oriented 3. Head/ENT:    Dry Mucous Membranes                          Head Non traumatic, neck supple                          Normal Dentition 4. SKIN: normal   Skin turgor,  Skin clean Dry and intact no rash 5. Heart: Regular rate and rhythm no Murmur, Rub or gallop 6. Lungs: coarse breaths sounds with occasional crackles   7. Abdomen: Soft, non-tender, Non distended 8. Lower extremities: no clubbing, cyanosis, or edema 9. Neurologically Grossly intact, moving all 4 extremities equally 10. MSK: Normal range of motion  body mass index is 25.83 kg/(m^2).   Labs on Admission:   Recent Labs  08/20/13 2349  NA 148*  K 4.0  CL 106  CO2 27  GLUCOSE 115*  BUN 34*  CREATININE 0.82  CALCIUM 9.2    Recent Labs  08/20/13 2349  AST  70*  ALT 53  ALKPHOS 108  BILITOT 0.5  PROT 8.1  ALBUMIN 3.5   No results found for this basename: LIPASE, AMYLASE,  in the last 72 hours  Recent Labs  08/20/13 2349  WBC 10.0  NEUTROABS 6.4  HGB 15.0  HCT 44.1  MCV 94.4  PLT 205   No results found for this basename: CKTOTAL, CKMB, CKMBINDEX, TROPONINI,  in the last 72 hours No results found for this basename: TSH, T4TOTAL, FREET3, T3FREE, THYROIDAB,  in the last 72 hours No  results found for this basename: VITAMINB12, FOLATE, FERRITIN, TIBC, IRON, RETICCTPCT,  in the last 72 hours No results found for this basename: HGBA1C    Estimated Creatinine Clearance: 87.8 ml/min (by C-G formula based on Cr of 0.82). ABG No results found for this basename: phart, pco2, po2, hco3, tco2, acidbasedef, o2sat     No results found for this basename: DDIMER    BNP (last 3 results) No results found for this basename: PROBNP,  in the last 8760 hours  Filed Weights   08/20/13 2335  Weight: 81.647 kg (180 lb)   Cultures: No results found for this basename: sdes, specrequest, cult, reptstatus  Radiological Exams on Admission: Ct Head Wo Contrast  08/21/2013   CLINICAL DATA:  Altered mental status.  Lethargy for 4 days.  EXAM: CT HEAD WITHOUT CONTRAST  TECHNIQUE: Contiguous axial images were obtained from the base of the skull through the vertex without intravenous contrast.  COMPARISON:  None.  FINDINGS: There is no evidence of acute infarction, mass lesion, or intra- or extra-axial hemorrhage on CT.  Prominence of the ventricles and sulci reflects moderate cortical volume loss. Diffuse periventricular and subcortical white matter change likely reflects small vessel ischemic microangiopathy.  The brainstem and fourth ventricle are within normal limits. The basal ganglia are unremarkable in appearance. The cerebral hemispheres demonstrate grossly normal gray-white differentiation. No mass effect or midline shift is seen.  There is no evidence of  fracture; visualized osseous structures are unremarkable in appearance. The orbits are within normal limits. The paranasal sinuses and mastoid air cells are well-aerated. No significant soft tissue abnormalities are seen.  IMPRESSION: 1. No acute intracranial pathology seen on CT. 2. Moderate cortical volume loss and diffuse small vessel ischemic microangiopathy.   Electronically Signed   By: Roanna Raider M.D.   On: 08/21/2013 01:05   Dg Chest Port 1 View  08/21/2013   CLINICAL DATA:  Shortness of breath and cough.  EXAM: PORTABLE CHEST - 1 VIEW  COMPARISON:  None.  FINDINGS: The lungs are well-aerated. Minimal left basilar opacity may reflect atelectasis or scarring. There is no evidence of pleural effusion or pneumothorax.  The cardiomediastinal silhouette is within normal limits. There is a chronic displaced fracture of the middle third of the left clavicle.  IMPRESSION: 1. Minimal left basilar opacity may reflect atelectasis or scarring; lungs otherwise clear. 2. Chronic displaced fracture of the middle third of the left clavicle.   Electronically Signed   By: Roanna Raider M.D.   On: 08/21/2013 00:21    Chart has been reviewed  Assessment/Plan  68 yo M with severe end stage dementia complicated by aphasia, and likely aspiration presents with hypoxia likely due to aspiration. Family wishes for patient to be comfort care.   Present on Admission:  . Aspiration pneumonia - cover with zosyn, comfort diet only, palliative care consult in AM, scopolamine patch to help with secretions.  . Hypoxia - provide oxygen as needed . Dementia - continue home medications, rapidly progressive disease end stage with poor prognosis.  . Hypertension - continue home medications if able to swalow   Prophylaxis: SCD    CODE STATUS:   DNR/DNI concentrate on comfort care only, palliative care consult in AM  Other plan as per orders.  I have spent a total of 65 min on this admission, extra time was taken to  discuss goals of care with family  Kenley Troop 08/21/2013, 2:39 AM  [facility]  Pager [phone]   If 7AM-7PM, please  contact the day team taking care of the patient  [email or web address]  Password [email or web address]

## 2013-08-22 DIAGNOSIS — Z515 Encounter for palliative care: Secondary | ICD-10-CM

## 2013-08-22 MED ORDER — DIAZEPAM 5 MG/ML IJ SOLN
5.0000 mg | Freq: Three times a day (TID) | INTRAMUSCULAR | Status: DC
Start: 2013-08-22 — End: 2013-08-23
  Administered 2013-08-22 – 2013-08-23 (×4): 5 mg via INTRAVENOUS
  Filled 2013-08-22 (×4): qty 2

## 2013-08-22 MED ORDER — SODIUM CHLORIDE 0.9 % IV SOLN
1.0000 mg/h | INTRAVENOUS | Status: DC
  Administered 2013-08-22: 2 mg/h via INTRAVENOUS
  Administered 2013-08-22: 10 mg/h via INTRAVENOUS
  Filled 2013-08-22 (×2): qty 10

## 2013-08-22 MED ORDER — DIAZEPAM 5 MG/ML IJ SOLN: 5.0000 mg | INTRAMUSCULAR | Status: DC | PRN

## 2013-08-22 MED ORDER — MORPHINE BOLUS VIA INFUSION
2.0000 mg | INTRAVENOUS | Status: DC | PRN
  Filled 2013-08-22: qty 2

## 2013-08-22 NOTE — Progress Notes (Addendum)
Notified Claiborne Billingsallahan, NP of pt temp 101. Tylenol 650mg  prn suppository given. 02 sats at 76% on [place] 2L. NP aware.   RN also made Palliative care aware at 2330.

## 2013-08-22 NOTE — Progress Notes (Addendum)
[facility] PROGRESS NOTE  Keith Herring RUE:[id number] DOB: December 04, 1945 DOA: 08/20/2013 PCP: Minda Meo, MD  Assessment/Plan: 1. Aspiration pneumonia- Zosyn has been discontinued as per palliative care. Started on Morphine infusion. 2. Dementia- Patient is nonverbal, end stage. 3. Goals of care- Family discussed with palliative care, he is now total comfort care. Palliative care following, possible discharge to inpatient hospice   Code Status: DNR Family Communication: *Discussed with daughter and wife on 08/21/13 Disposition Plan: *Hospice   Consultants:  Palliative care  Procedures:  None  Antibiotics:  None  HPI/Subjective: Patient seen and examined, looks better. Nonverbal.  Objective: Filed Vitals:   08/22/13 1316  BP: 131/78  Pulse: 56  Temp: 98.2 F (36.8 C)  Resp: 18    Intake/Output Summary (Last 24 hours) at 08/22/13 1415 Last data filed at 08/22/13 0955  Gross per 24 hour  Intake      0 ml  Output   1000 ml  Net  -1000 ml   Filed Weights   08/20/13 2335 08/21/13 0431  Weight: 81.647 kg (180 lb) 82 kg (180 lb 12.4 oz)    Exam:  Physical Exam: Head: Normocephalic, atraumatic.  Lungs: Normal respiratory effort. B/L Clear to auscultation, no crackles or wheezes.  Heart: Regular RR. S1 and S2 normal  Abdomen: BS normoactive. Soft, Nondistended, non-tender.  Extremities: No pretibial edema, no erythema  Data Reviewed: Basic Metabolic Panel:  Recent Labs Lab 08/20/13 2349  NA 148*  K 4.0  CL 106  CO2 27  GLUCOSE 115*  BUN 34*  CREATININE 0.82  CALCIUM 9.2   Liver Function Tests:  Recent Labs Lab 08/20/13 2349  AST 70*  ALT 53  ALKPHOS 108  BILITOT 0.5  PROT 8.1  ALBUMIN 3.5   No results found for this basename: LIPASE, AMYLASE,  in the last 168 hours No results found for this basename: AMMONIA,  in the last 168 hours CBC:  Recent Labs Lab 08/20/13 2349  WBC 10.0  NEUTROABS 6.4  HGB 15.0  HCT 44.1   MCV 94.4  PLT 205   Cardiac Enzymes: No results found for this basename: CKTOTAL, CKMB, CKMBINDEX, TROPONINI,  in the last 168 hours BNP (last 3 results) No results found for this basename: PROBNP,  in the last 8760 hours CBG: No results found for this basename: GLUCAP,  in the last 168 hours  Recent Results (from the past 240 hour(s))  MRSA PCR SCREENING     Status: None   Collection Time    08/21/13  7:51 AM      Result Value Ref Range Status   MRSA by PCR NEGATIVE  NEGATIVE Final   Comment:            The GeneXpert MRSA Assay (FDA     approved for NASAL specimens     only), is one component of a     comprehensive MRSA colonization     surveillance program. It is not     intended to diagnose MRSA     infection nor to guide or     monitor treatment for     MRSA infections.     Studies: Ct Head Wo Contrast  08/21/2013   CLINICAL DATA:  Altered mental status.  Lethargy for 4 days.  EXAM: CT HEAD WITHOUT CONTRAST  TECHNIQUE: Contiguous axial images were obtained from the base of the skull through the vertex without intravenous contrast.  COMPARISON:  None.  FINDINGS: There is no evidence of acute infarction,  mass lesion, or intra- or extra-axial hemorrhage on CT.  Prominence of the ventricles and sulci reflects moderate cortical volume loss. Diffuse periventricular and subcortical white matter change likely reflects small vessel ischemic microangiopathy.  The brainstem and fourth ventricle are within normal limits. The basal ganglia are unremarkable in appearance. The cerebral hemispheres demonstrate grossly normal gray-white differentiation. No mass effect or midline shift is seen.  There is no evidence of fracture; visualized osseous structures are unremarkable in appearance. The orbits are within normal limits. The paranasal sinuses and mastoid air cells are well-aerated. No significant soft tissue abnormalities are seen.  IMPRESSION: 1. No acute intracranial pathology seen on CT. 2.  Moderate cortical volume loss and diffuse small vessel ischemic microangiopathy.   Electronically Signed   By: Roanna RaiderJeffery  Chang M.D.   On: 08/21/2013 01:05   Dg Chest Port 1 View  08/21/2013   CLINICAL DATA:  Shortness of breath and cough.  EXAM: PORTABLE CHEST - 1 VIEW  COMPARISON:  None.  FINDINGS: The lungs are well-aerated. Minimal left basilar opacity may reflect atelectasis or scarring. There is no evidence of pleural effusion or pneumothorax.  The cardiomediastinal silhouette is within normal limits. There is a chronic displaced fracture of the middle third of the left clavicle.  IMPRESSION: 1. Minimal left basilar opacity may reflect atelectasis or scarring; lungs otherwise clear. 2. Chronic displaced fracture of the middle third of the left clavicle.   Electronically Signed   By: Roanna RaiderJeffery  Chang M.D.   On: 08/21/2013 00:21    Scheduled Meds: . diazepam  5 mg Intravenous 3 times per day  . fluticasone  1 spray Each Nare BID  . furosemide  20 mg Oral Daily  . lidocaine  1 patch Transdermal Q24H  . scopolamine  1 patch Transdermal Q72H  . sodium chloride  3 mL Intravenous Q12H   Continuous Infusions: . morphine 10 mg/hr (08/22/13 0910)    Active Problems:   Aspiration pneumonia   Hypoxia   Dementia   Hypertension    Time spent: 25 min    [facility],[name] S  [facility] Pager [phone] If 7PM-7AM, please contact night-coverage at [email or web address], password [email or web address] 08/22/2013, 2:15 PM  LOS: 2 days

## 2013-08-22 NOTE — Progress Notes (Signed)
Palliative Care Team at [facility] Progress Note   SUBJECTIVE: Patient continues to have pain, he appears agitated and confused. Poor PO intake this AM.  Interval Events: Palliative Consult 7/18.  OBJECTIVE: Vital Signs: BP 121/75  Pulse 60  Temp(Src) 97.8 F (36.6 C) (Oral)  Resp 16  Ht 5\' 10"  (1.778 m)  Wt 82 kg (180 lb 12.4 oz)  BMI 25.94 kg/m2  SpO2 97%   Intake and Output: 07/18 0701 - 07/19 0700 In: -  Out: 850 [Urine:850]  Physical Exam: General: Vital signs reviewed and noted. Diaphoretic, restless  Head: Normocephalic, atraumatic.  Lungs:  Congested  Heart: RRR. S1 and S2 normal without gallop,  or rubs. (+) murmur  Abdomen:  BS normoactive. Soft, Nondistended, non-tender.  No masses or organomegaly.  Extremities: No edema.    No Known Allergies  Medications: Scheduled Meds:  . fluticasone  1 spray Each Nare BID  . furosemide  20 mg Oral Daily  . lidocaine  1 patch Transdermal Q24H  . scopolamine  1 patch Transdermal Q72H  . sodium chloride  3 mL Intravenous Q12H    Continuous Infusions: . morphine      PRN Meds: sodium chloride, acetaminophen, acetaminophen, albuterol, bisacodyl, haloperidol lactate, LORazepam, morphine, ondansetron (ZOFRAN) IV, ondansetron, sodium chloride  Stool Softner: yes  Palliative Performance Scale: 20 %   ASSESSMENT/ PLAN: Keith Herring has an early onset progressive dementia (dx in 2005) associated with diffuse chronic muscle pain, behavioral disturbance and a progressive decline. He was admitted with hypoxia in setting of aspiration PNA, since admission he has been agitated with worsening encephalopathy. His pain persists, NT reports significant pain with movement and pushed away his breakfast.  [facility]. I spoke with patient';s daughter Keith Herring and his wife by phone this AM- they confirm full comfort-no life prolonging interventions in his current conditions and to allow  for a natural, comfortable death to occur.   Full comfort, stopped Zosyn  Scheduled morphine not holding his pain-will start low dose continuous infusion  I discussed Hospice for EOL care- recommended Hospice facility-family agreeable  Will schedule Valium for muscle spasms and for sedation  Prognosis: days-week- possibly much less if he declines from a respiratory standpoint.  He requires close monitoring and aggressive management of his EOL symptoms.  Anderson MaltaElizabeth Golding, DO Palliative Medicine    Time In: 8:30 Time Out: 8:55 Total Time Spent with Patient: 25 min Total Overall Time: 25min   Greater than 50%  of this time was spent counseling and coordinating care related to the above assessment and plan.   Edsel PetrinElizabeth L Golding, DO  08/22/2013, 8:43 AM  Please contact Palliative Medicine Team phone at [phone] for questions and concerns.

## 2013-08-22 NOTE — Progress Notes (Signed)
Patient keeps taking off his oxygen; satO2 on room air = 95%. Patient continue to receive Morphine IV - his wife wants him to be on comfort, with no pain. After Morphine was starting, patient continue to be in pain - IV is now on 10cc/hr.

## 2013-08-23 MED ORDER — LIDOCAINE 5 % EX PTCH: 1.0000 | MEDICATED_PATCH | CUTANEOUS | Status: AC

## 2013-08-23 MED ORDER — SCOPOLAMINE 1 MG/3DAYS TD PT72: 1.0000 | MEDICATED_PATCH | TRANSDERMAL | Status: AC

## 2013-08-23 MED ORDER — CLONAZEPAM 1 MG PO TABS: 1.0000 mg | ORAL_TABLET | Freq: Every day | ORAL | Status: AC

## 2013-08-23 MED ORDER — MORPHINE SULFATE (CONCENTRATE) 10 MG /0.5 ML PO SOLN: 5.0000 mg | ORAL | Status: AC

## 2013-08-23 MED ORDER — BISACODYL 10 MG RE SUPP: 10.0000 mg | Freq: Every day | RECTAL | Status: AC | PRN

## 2013-08-23 NOTE — Consult Note (Signed)
[facility] Liaison: Received request from [facility] for family interest in [facility]. Chart reviewed. Met with spouse at bedside to complete admission paperwork for transfer today. Please fax discharge summary to [phone] and have RN call report to [phone]. Thank you. Erling Conte LCSW [phone]

## 2013-08-23 NOTE — Discharge Summary (Addendum)
Physician Discharge Summary  Keith Herring AVW:[id number] DOB: 02-Apr-1945 DOA: 08/20/2013  PCP: Keith Meo, MD  Admit date: 08/20/2013 Discharge date: 08/23/2013  Time spent: *45 minutes  Recommendations for Outpatient Follow-up:  1. Hospice physician  Discharge Diagnoses:  Active Problems:   Aspiration pneumonia   Hypoxia   Dementia   Hypertension   Discharge Condition: Stable  Diet recommendation: Regular diet  Filed Weights   08/20/13 2335 08/21/13 0431  Weight: 81.647 kg (180 lb) 82 kg (180 lb 12.4 oz)    History of present illness:  68 y.o. male  has a past medical history of Hypertension and Alzheimer's dementia.  Patient have had sever progressive dementia for the past 8 years. He lives with his wife at [facility] assisted living. For the past 1.5 weeks he has been wheelchair or bed bound. He is incontinent of urine and stool. He is aphasic. Family states he gets very agitated. He has chronic pain. Patient often screams out in pain. For the past 3 days he could not lay flat and had trouble swallowing saliva. He was turning blue. Family brought him to ER. He was noted to be hypoxic with course breath sounds on exam but CXR did not show infiltrate. Spoke to family who wishes for patient to be comfort care only ok with antibiotics. Would like to discuss with hospice. Interested in palliative care consult.  Hospitalist was called for admission for hypoxia likely due to aspiration      Hospital Course:  1. Aspiration pneumonia- patient was admitted with aspiration pneumonia and was started on Zosyn.  Zosyn was been discontinued as per palliative care. Started on Morphine infusion. Patient at this time is total comfort care after the discussion of palliative care with the family 2. Dementia- Patient is nonverbal, end stage. Patient does not have any behavior disturbance. Continue Aricept 3. Goals of care- Family discussed with palliative care, he is now  total comfort care. Family agrees to send the patient to residential hospice. Patient has a poor prognosis, likely has days to  Weeks.   Procedures:  none Consultations:  Palliative care  Discharge Exam: Filed Vitals:   08/23/13 0530  BP: 127/73  Pulse: 74  Temp: 99.1 F (37.3 C)  Resp: 14    General: appear in no acute distress Cardiovascular: S1-S2 regular Respiratory: *clear bilaterally  Discharge Instructions You were cared for by a hospitalist during your hospital stay. If you have any questions about your discharge medications or the care you received while you were in the hospital after you are discharged, you can call the unit and asked to speak with the hospitalist on call if the hospitalist that took care of you is not available. Once you are discharged, your primary care physician will handle any further medical issues. Please note that NO REFILLS for any discharge medications will be authorized once you are discharged, as it is imperative that you return to your primary care physician (or establish a relationship with a primary care physician if you do not have one) for your aftercare needs so that they can reassess your need for medications and monitor your lab values.  Discharge Instructions   Diet - low sodium heart healthy    Complete by:  As directed      Increase activity slowly    Complete by:  As directed             Medication List    STOP taking these medications  fexofenadine 180 MG tablet  Commonly known as:  ALLEGRA     fluticasone 50 MCG/ACT nasal spray  Commonly known as:  FLONASE     QUEtiapine 25 MG tablet  Commonly known as:  SEROQUEL     Vitamin D-3 1000 UNITS Caps      TAKE these medications       acetaminophen 325 MG tablet  Commonly known as:  TYLENOL  Take 650 mg by mouth 2 (two) times daily as needed for mild pain.     amLODipine 5 MG tablet  Commonly known as:  NORVASC  Take 5 mg by mouth daily.     aspirin EC 81 MG  tablet  Take 81 mg by mouth daily.     bisacodyl 10 MG suppository  Commonly known as:  DULCOLAX  Place 1 suppository (10 mg total) rectally daily as needed for moderate constipation.     clonazePAM 1 MG tablet  Commonly known as:  KLONOPIN  Take 1 tablet (1 mg total) by mouth at bedtime.     divalproex 125 MG capsule  Commonly known as:  DEPAKOTE SPRINKLE  Take 125 mg by mouth 2 (two) times daily.     docusate sodium 100 MG capsule  Commonly known as:  COLACE  Take 100 mg by mouth daily.     donepezil 23 MG Tabs tablet  Commonly known as:  ARICEPT  Take 23 mg by mouth daily.     furosemide 20 MG tablet  Commonly known as:  LASIX  Take 20 mg by mouth daily.     lidocaine 5 %  Commonly known as:  LIDODERM  Place 1 patch onto the skin daily. Remove & Discard patch within 12 hours or as directed by MD     morphine CONCENTRATE 10 mg / 0.5 ml concentrated solution  Place 0.25 mLs (5 mg total) under the tongue every 4 (four) hours.     NAMENDA 10 MG tablet  Generic drug:  memantine  Take 10 mg by mouth 2 (two) times daily.     nystatin cream  Commonly known as:  MYCOSTATIN  Apply 1 application topically 2 (two) times daily.     scopolamine 1 MG/3DAYS  Commonly known as:  TRANSDERM-SCOP  Place 1 patch (1.5 mg total) onto the skin every 3 (three) days.     sertraline 50 MG tablet  Commonly known as:  ZOLOFT  Take 50 mg by mouth daily.       No Known Allergies    The results of significant diagnostics from this hospitalization (including imaging, microbiology, ancillary and laboratory) are listed below for reference.    Significant Diagnostic Studies: Ct Head Wo Contrast  08/21/2013   CLINICAL DATA:  Altered mental status.  Lethargy for 4 days.  EXAM: CT HEAD WITHOUT CONTRAST  TECHNIQUE: Contiguous axial images were obtained from the base of the skull through the vertex without intravenous contrast.  COMPARISON:  None.  FINDINGS: There is no evidence of acute  infarction, mass lesion, or intra- or extra-axial hemorrhage on CT.  Prominence of the ventricles and sulci reflects moderate cortical volume loss. Diffuse periventricular and subcortical white matter change likely reflects small vessel ischemic microangiopathy.  The brainstem and fourth ventricle are within normal limits. The basal ganglia are unremarkable in appearance. The cerebral hemispheres demonstrate grossly normal gray-white differentiation. No mass effect or midline shift is seen.  There is no evidence of fracture; visualized osseous structures are unremarkable in appearance. The orbits are within  normal limits. The paranasal sinuses and mastoid air cells are well-aerated. No significant soft tissue abnormalities are seen.  IMPRESSION: 1. No acute intracranial pathology seen on CT. 2. Moderate cortical volume loss and diffuse small vessel ischemic microangiopathy.   Electronically Signed   By: Roanna Raider M.D.   On: 08/21/2013 01:05   Dg Chest Port 1 View  08/21/2013   CLINICAL DATA:  Shortness of breath and cough.  EXAM: PORTABLE CHEST - 1 VIEW  COMPARISON:  None.  FINDINGS: The lungs are well-aerated. Minimal left basilar opacity may reflect atelectasis or scarring. There is no evidence of pleural effusion or pneumothorax.  The cardiomediastinal silhouette is within normal limits. There is a chronic displaced fracture of the middle third of the left clavicle.  IMPRESSION: 1. Minimal left basilar opacity may reflect atelectasis or scarring; lungs otherwise clear. 2. Chronic displaced fracture of the middle third of the left clavicle.   Electronically Signed   By: Roanna Raider M.D.   On: 08/21/2013 00:21    Microbiology: Recent Results (from the past 240 hour(s))  MRSA PCR SCREENING     Status: None   Collection Time    08/21/13  7:51 AM      Result Value Ref Range Status   MRSA by PCR NEGATIVE  NEGATIVE Final   Comment:            The GeneXpert MRSA Assay (FDA     approved for NASAL  specimens     only), is one component of a     comprehensive MRSA colonization     surveillance program. It is not     intended to diagnose MRSA     infection nor to guide or     monitor treatment for     MRSA infections.     Labs: Basic Metabolic Panel:  Recent Labs Lab 08/20/13 2349  NA 148*  K 4.0  CL 106  CO2 27  GLUCOSE 115*  BUN 34*  CREATININE 0.82  CALCIUM 9.2   Liver Function Tests:  Recent Labs Lab 08/20/13 2349  AST 70*  ALT 53  ALKPHOS 108  BILITOT 0.5  PROT 8.1  ALBUMIN 3.5   No results found for this basename: LIPASE, AMYLASE,  in the last 168 hours No results found for this basename: AMMONIA,  in the last 168 hours CBC:  Recent Labs Lab 08/20/13 2349  WBC 10.0  NEUTROABS 6.4  HGB 15.0  HCT 44.1  MCV 94.4  PLT 205   Cardiac Enzymes: No results found for this basename: CKTOTAL, CKMB, CKMBINDEX, TROPONINI,  in the last 168 hours BNP: BNP (last 3 results) No results found for this basename: PROBNP,  in the last 8760 hours CBG: No results found for this basename: GLUCAP,  in the last 168 hours     Signed:  Orvill Coulthard S  [facility] 08/23/2013, 11:51 AM

## 2013-08-23 NOTE — Clinical Social Work Note (Signed)
Per MD [facility]. CSW contacted wife to make aware of DC. CSW requested transport. DC packet on chart, with number for report. CSW signing off at this time.   Roddie McBryant Bralen Wiltgen MSW, [place]WA, [place], [phone]

## 2013-08-23 NOTE — Progress Notes (Signed)
[facility]. IV d/c'd. Skin intact. Vitals are stable. Report called to receiving facility. Pt to be transported by ambulance service.

## 2013-08-23 NOTE — Care Management Note (Signed)
    Page 1 of 1   08/23/2013     12:25:23 PM CARE MANAGEMENT NOTE 08/23/2013  Patient:  Keith SongSCHWEITZER,Jerrit F   Account Number:  [id number]  Date Initiated:  08/23/2013  Documentation initiated by:  Letha CapeAYLOR,Safiyyah Vasconez  Subjective/Objective Assessment:   dx seizures  admit- from [facility].     Action/Plan:   Palliative- rec Residential Hospice.   Anticipated DC Date:  08/23/2013   Anticipated DC Plan:  HOSPICE MEDICAL FACILITY  In-house referral  Clinical Social Worker      DC Planning Services  CM consult      [facility]   Choice offered to / List presented to:             Status of service:  Completed, signed off Medicare Important Message given?  YES (If response is "NO", the following Medicare IM given date fields will be blank) Date Medicare IM given:  08/23/2013 Medicare IM given by:  Letha CapeAYLOR,Hurley Sobel Date Additional Medicare IM given:   Additional Medicare IM given by:    Discharge Disposition:  HOSPICE MEDICAL FACILITY  Per UR Regulation:  Reviewed for med. necessity/level of care/duration of stay  If discussed at Long Length of Stay Meetings, dates discussed:    Comments:

## 2013-08-24 NOTE — Clinical Social Work Psychosocial (Signed)
Clinical Social Work Department BRIEF PSYCHOSOCIAL ASSESSMENT 08/24/2013  Patient:  Keith Herring,Keith Herring     Account Number:  [id number]     Admit date:  08/20/2013  Clinical Social Worker:  Lavell LusterAMPBELL,Kortney Potvin BRYANT, LCSWA  Date/Time:  08/23/2013 10:00 AM  Referred by:  Physician  Date Referred:  08/24/2013 Referred for  Residential hospice placement   Other Referral:   Interview type:  Family Other interview type:    PSYCHOSOCIAL DATA Living Status:  WIFE Admitted from facility:   Level of care:   Primary support name:  Keith Herring Primary support relationship to patient:  SPOUSE Degree of support available:   Support is good.    CURRENT CONCERNS Current Concerns  Post-Acute Placement   Other Concerns:    SOCIAL WORK ASSESSMENT / PLAN CSW spoke with patient's wife by phone. [facility] CSW has made referrals and [facility] is able to accept patient today. Patient's [facility]   Assessment/plan status:  Psychosocial Support/Ongoing Assessment of Needs Other assessment/ plan:   Send Referrals   Information/referral to community resources:   CSW contact information and residential hospice list given.    PATIENT'S/FAMILY'S RESPONSE TO PLAN OF CARE: Patient's wife plans for patient to DC to [facility]. CSW to assist.       Roddie McBryant Kortney Schoenfelder MSW, [place]CSWA, [place], [phone]

## 2013-09-04 DEATH — deceased

## 2016-02-21 IMAGING — CT CT HEAD W/O CM
2 of 4 series · 10 of 30 positions shown, 12 images · non-contrast
Comparison: None.

CLINICAL DATA: Altered mental status.  Lethargy for 4 days.

EXAM:
CT HEAD WITHOUT CONTRAST
TECHNIQUE: Contiguous axial images were obtained from the base of the skull
through the vertex without intravenous contrast.

[Series 205: axial st reformats · axial · 0.49mm/px · z∈[+179,+259]mm · 5 of 29 slices shown, 7 images]
[im 5/29  brain]
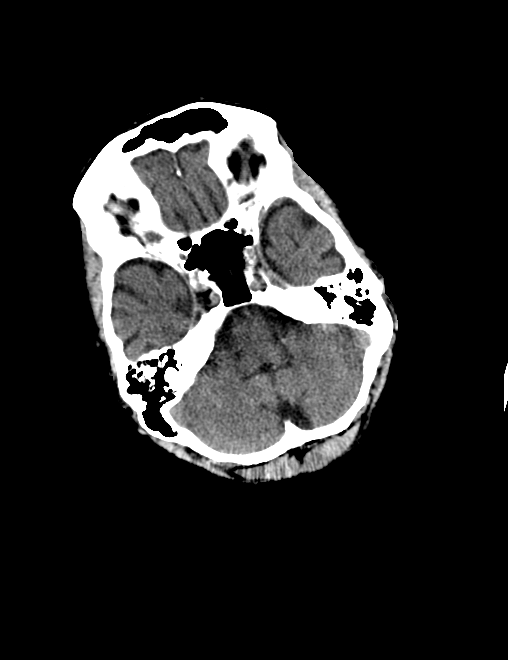
[im 5/29  bone]
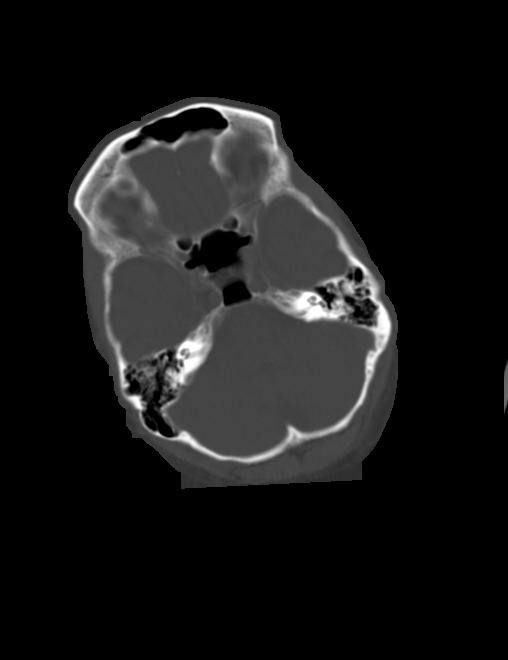
[im 10/29  brain]
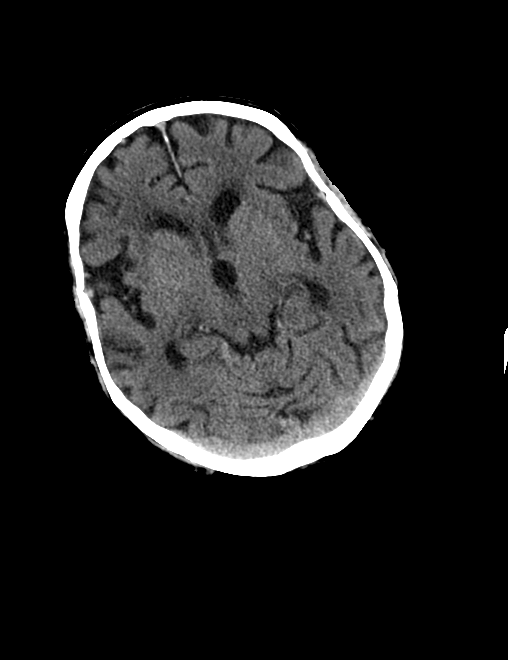
[im 15/29  brain]
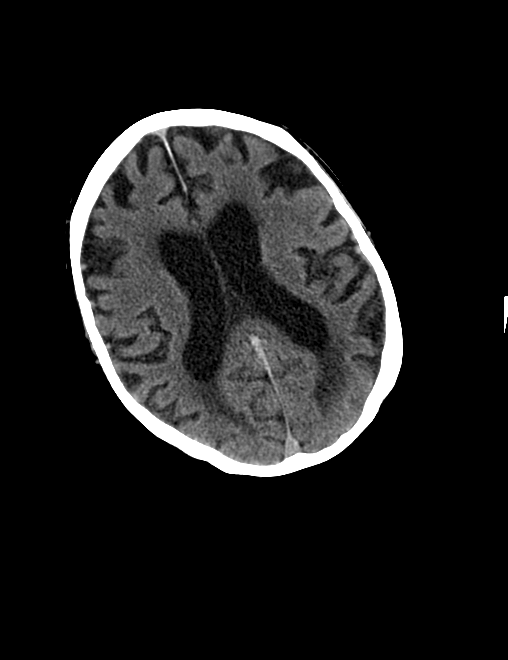
[im 19/29  brain]
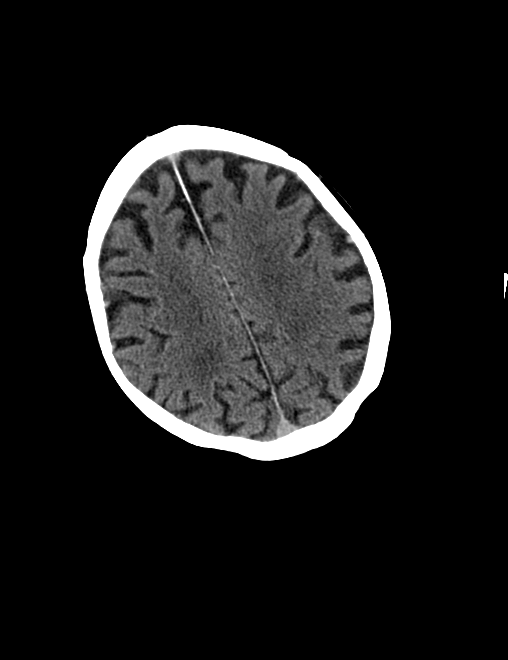
[im 24/29  brain]
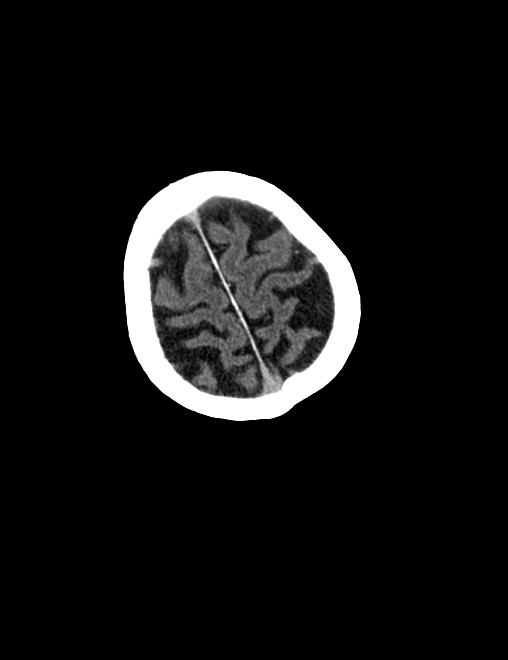
[im 24/29  bone]
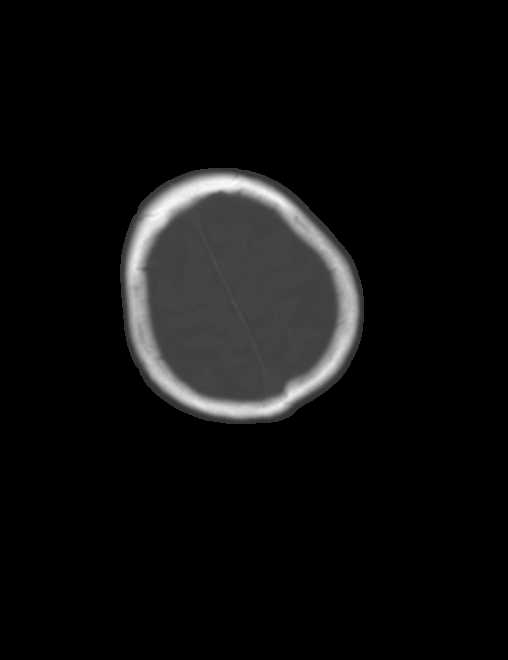

[Series 206: axial bone reformats · axial · 0.49mm/px · z∈[+178,+259]mm · 5 of 29 slices shown]
[im 5/29  bone]
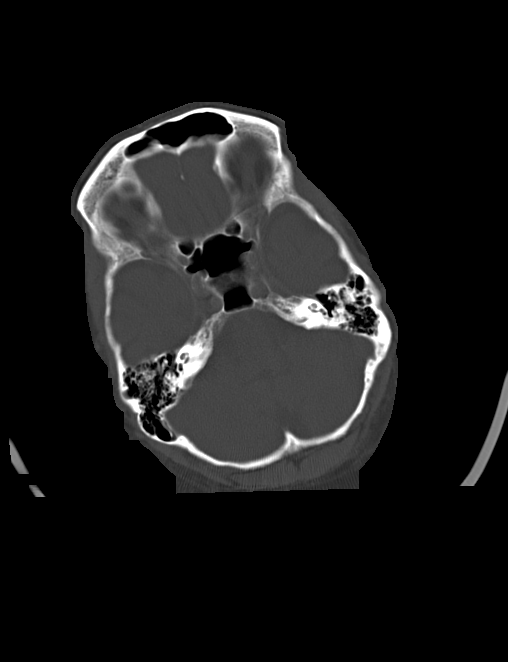
[im 10/29  bone]
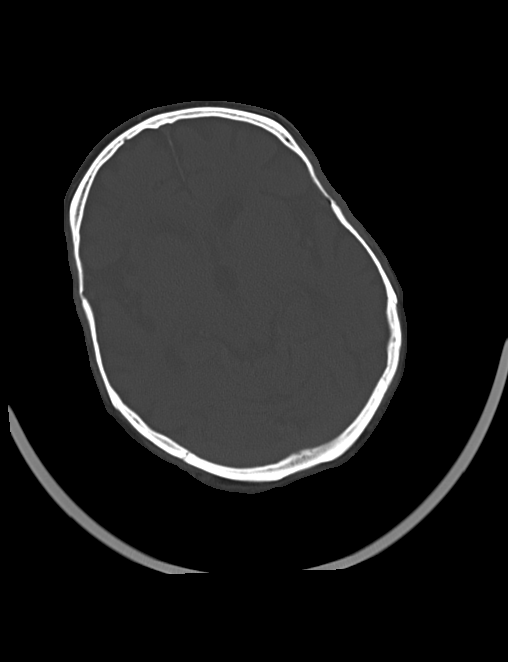
[im 15/29  bone]
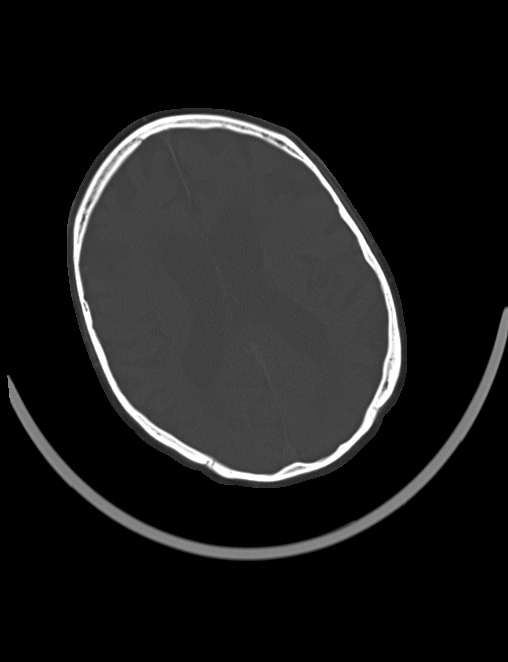
[im 19/29  bone]
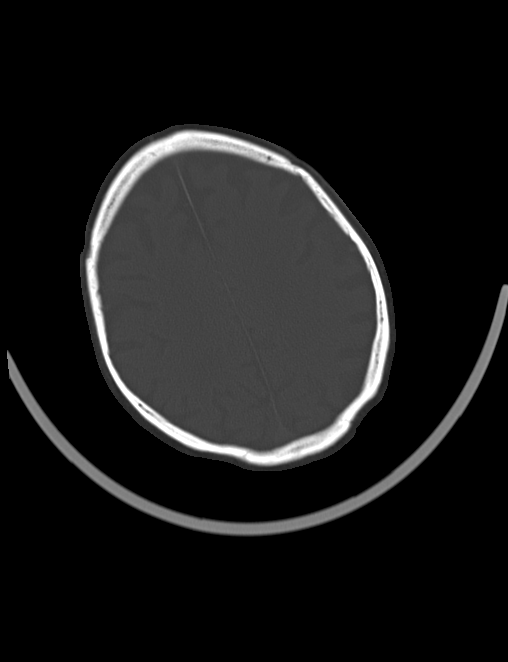
[im 24/29  bone]
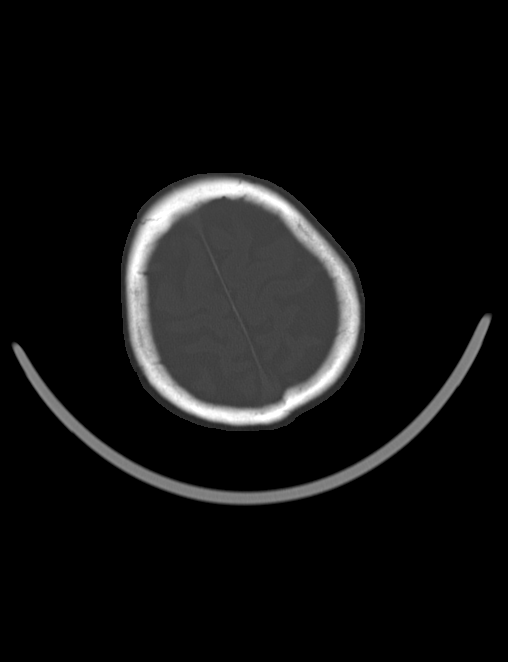

[10 of 30 positions shown; findings below may reference images not displayed]

FINDINGS: There is no evidence of acute infarction, mass lesion, or intra- or
extra-axial hemorrhage on CT.

Prominence of the ventricles and sulci reflects moderate cortical
volume loss. Diffuse periventricular and subcortical white matter
change likely reflects small vessel ischemic microangiopathy.

The brainstem and fourth ventricle are within normal limits. The
basal ganglia are unremarkable in appearance. The cerebral
hemispheres demonstrate grossly normal gray-white differentiation.
No mass effect or midline shift is seen.

There is no evidence of fracture; visualized osseous structures are
unremarkable in appearance. The orbits are within normal limits. The
paranasal sinuses and mastoid air cells are well-aerated. No
significant soft tissue abnormalities are seen.
IMPRESSION: 1. No acute intracranial pathology seen on CT.
2. Moderate cortical volume loss and diffuse small vessel ischemic
microangiopathy.
